# Patient Record
Sex: Female | Born: 2000 | Race: White | Hispanic: No | Marital: Single | Smoking: Never smoker
Health system: Southern US, Community
[De-identification: ages and names within clinical notes are randomized; demographics above are authoritative.]

---
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
## 2021-10-29 ENCOUNTER — Other Ambulatory Visit: Payer: Self-pay

## 2021-10-29 ENCOUNTER — Encounter: Payer: Self-pay | Admitting: Oncology

## 2021-10-29 ENCOUNTER — Ambulatory Visit (INDEPENDENT_AMBULATORY_CARE_PROVIDER_SITE_OTHER): Admitting: Oncology

## 2021-10-29 VITALS — BP 102/60 | HR 66 | Temp 99.2°F | Resp 18 | Ht 64.0 in | Wt 134.0 lb

## 2021-10-29 DIAGNOSIS — R0981 Nasal congestion: Secondary | ICD-10-CM | POA: Diagnosis not present

## 2021-10-29 DIAGNOSIS — R051 Acute cough: Secondary | ICD-10-CM | POA: Diagnosis not present

## 2021-10-29 MED ORDER — AZITHROMYCIN 250 MG PO TABS: ORAL_TABLET | ORAL | 0 refills | Status: AC

## 2021-10-29 MED ORDER — BENZONATATE 100 MG PO CAPS: 100.0000 mg | ORAL_CAPSULE | Freq: Three times a day (TID) | ORAL | 0 refills | Status: DC | PRN

## 2021-10-29 NOTE — Progress Notes (Signed)
[facility] [address] [place], [place] [address] Phone: [phone] Fax: [phone]   Office Visit Note  Patient Name: Susan Black  Date of [id number]:[id number]  Med Rec number [id number]  Date of Service: 10/29/2021  Patient has no known allergies.  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  Describes mucus as thick green and yellow.  Chest pain associated with cough.  Cough mainly at bedtime.  Unable to sleep.  Denies any COVID exposure. Gets bronchitis often and is normally prescribed benzonatate and an antibiotic. Tx last about a year ago. No asthma hx. Heard some wheezing last night. Has not tried anything otc.  Feels like bronchitis.  Current Medication:  Outpatient Encounter Medications as of 10/29/2021  Medication Sig   azithromycin (ZITHROMAX) 250 MG tablet Take 2 tablets on day 1, then 1 tablet daily on days 2 through 5   benzonatate (TESSALON PERLES) 100 MG capsule Take 1 capsule (100 mg total) by mouth 3 (three) times daily as needed for cough.   [DISCONTINUED] amoxicillin-clavulanate (AUGMENTIN) 875-125 MG tablet Take 1 tablet by mouth 2 (two) times daily.   [DISCONTINUED] guanFACINE (INTUNIV) 1 MG TB24 ER tablet Take 1 mg by mouth daily.   No facility-administered encounter medications on file as of 10/29/2021.      Medical History: History reviewed. No pertinent past medical history.   Vital Signs: BP 102/60   Pulse 66   Temp 99.2 F (37.3 C) (Tympanic)   Resp 18   Ht 5\' 4"  (1.626 m)   Wt 134 lb (60.8 kg)   SpO2 99%   BMI 23.00 kg/m   ROS: As per HPI.  All other pertinent ROS negative.     Review of Systems  Constitutional:  Positive for fatigue.  HENT:  Positive for postnasal drip and sore throat.   Respiratory:  Positive for cough, shortness of breath and wheezing.   Cardiovascular:  Positive for chest pain.   Musculoskeletal:  Positive for myalgias.  Psychiatric/Behavioral:  Positive for sleep disturbance (d/t cough).     Physical Exam Constitutional:      Appearance: Normal appearance.  HENT:     Nose:     Right Turbinates: Pale.     Left Turbinates: Pale.     Mouth/Throat:     Mouth: Mucous membranes are moist.     Pharynx: Posterior oropharyngeal erythema present.     Tonsils: 1+ on the right. 2+ on the left.  Cardiovascular:     Rate and Rhythm: Normal rate and regular rhythm.  Pulmonary:     Effort: Pulmonary effort is normal.     Breath sounds: Examination of the right-lower field reveals rhonchi. Rhonchi present. No decreased breath sounds.  Neurological:     Mental Status: She is alert.     No results found for this or any previous visit (from the past 24 hour(s)).  Assessment/Plan: 1. Acute cough -Exam concerning for Bronchitis and URI.  Given history would recommend going ahead and treating. -Recommend azithromycin x5 days starting today.  Can also try Tessalon perles 3 times daily for cough.  Can try over-the-counter Delsym as well. -Recommend picking up a decongestant such as Mucinex and/or Sudafed.  - benzonatate (TESSALON PERLES) 100 MG capsule; Take 1 capsule (100 mg total) by mouth 3 (three) times daily as needed for  cough.  Dispense: 20 capsule; Refill: 0 - azithromycin (ZITHROMAX) 250 MG tablet; Take 2 tablets on day 1, then 1 tablet daily on days 2 through 5  Dispense: 6 tablet; Refill: 0  2. Nasal congestion -Decongestant such as Sudafed or Mucinex. -See plan above.   Disposition-return to clinic as needed.  General Counseling: jane birkel understanding of the findings of todays visit and agrees with plan of treatment. I have discussed any further diagnostic evaluation that may be needed or ordered today. We also reviewed her medications today. she has been encouraged to call the office with any questions or concerns that should arise related to  todays visit.   No orders of the defined types were placed in this encounter.   Meds ordered this encounter  Medications   benzonatate (TESSALON PERLES) 100 MG capsule    Sig: Take 1 capsule (100 mg total) by mouth 3 (three) times daily as needed for cough.    Dispense:  20 capsule    Refill:  0   azithromycin (ZITHROMAX) 250 MG tablet    Sig: Take 2 tablets on day 1, then 1 tablet daily on days 2 through 5    Dispense:  6 tablet    Refill:  0    I spent 15 minutes dedicated to the care of this patient (face-to-face and non-face-to-face) on the date of the encounter to include what is described in the assessment and plan.   Durenda Hurt, NP 10/29/2021 10:49 AM

## 2021-12-12 IMAGING — CR DG NECK SOFT TISSUE
2 series · 2 of 2 positions shown · non-contrast
Comparison: None.

CLINICAL DATA: Sore throat

EXAM:
NECK SOFT TISSUES - 1+ VIEW

[neck lat]
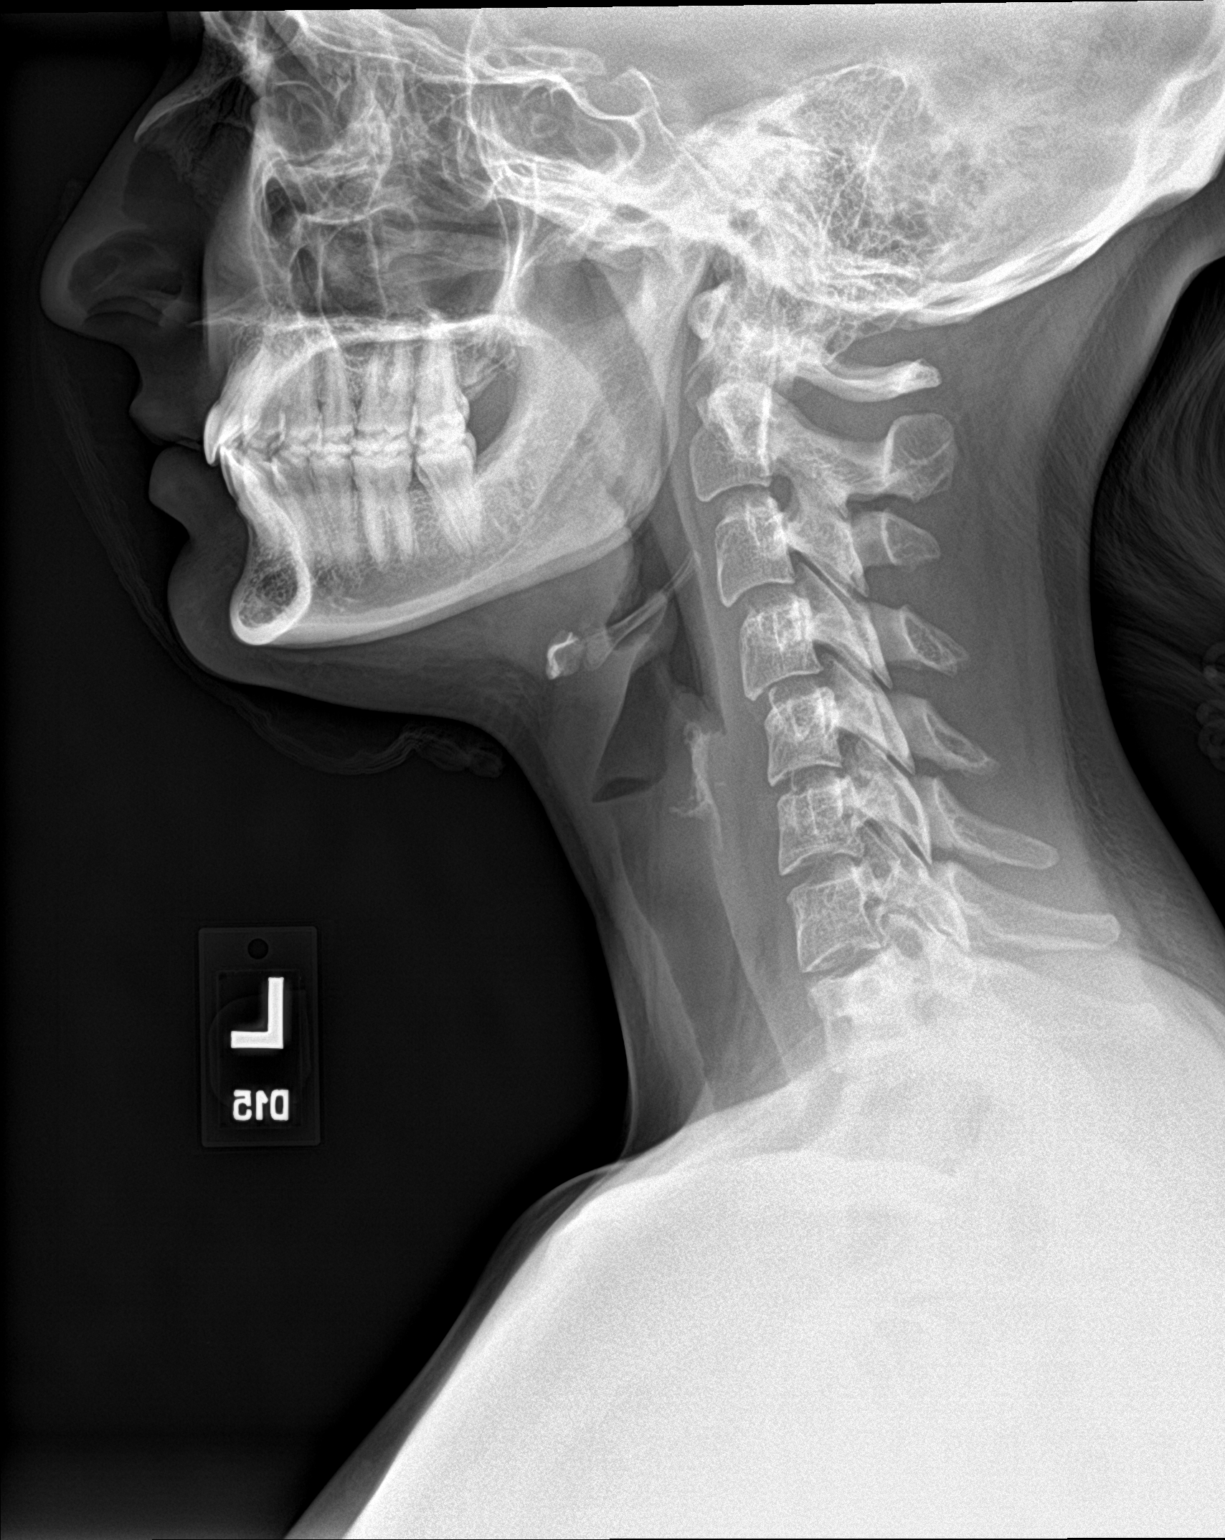

[neck ap]
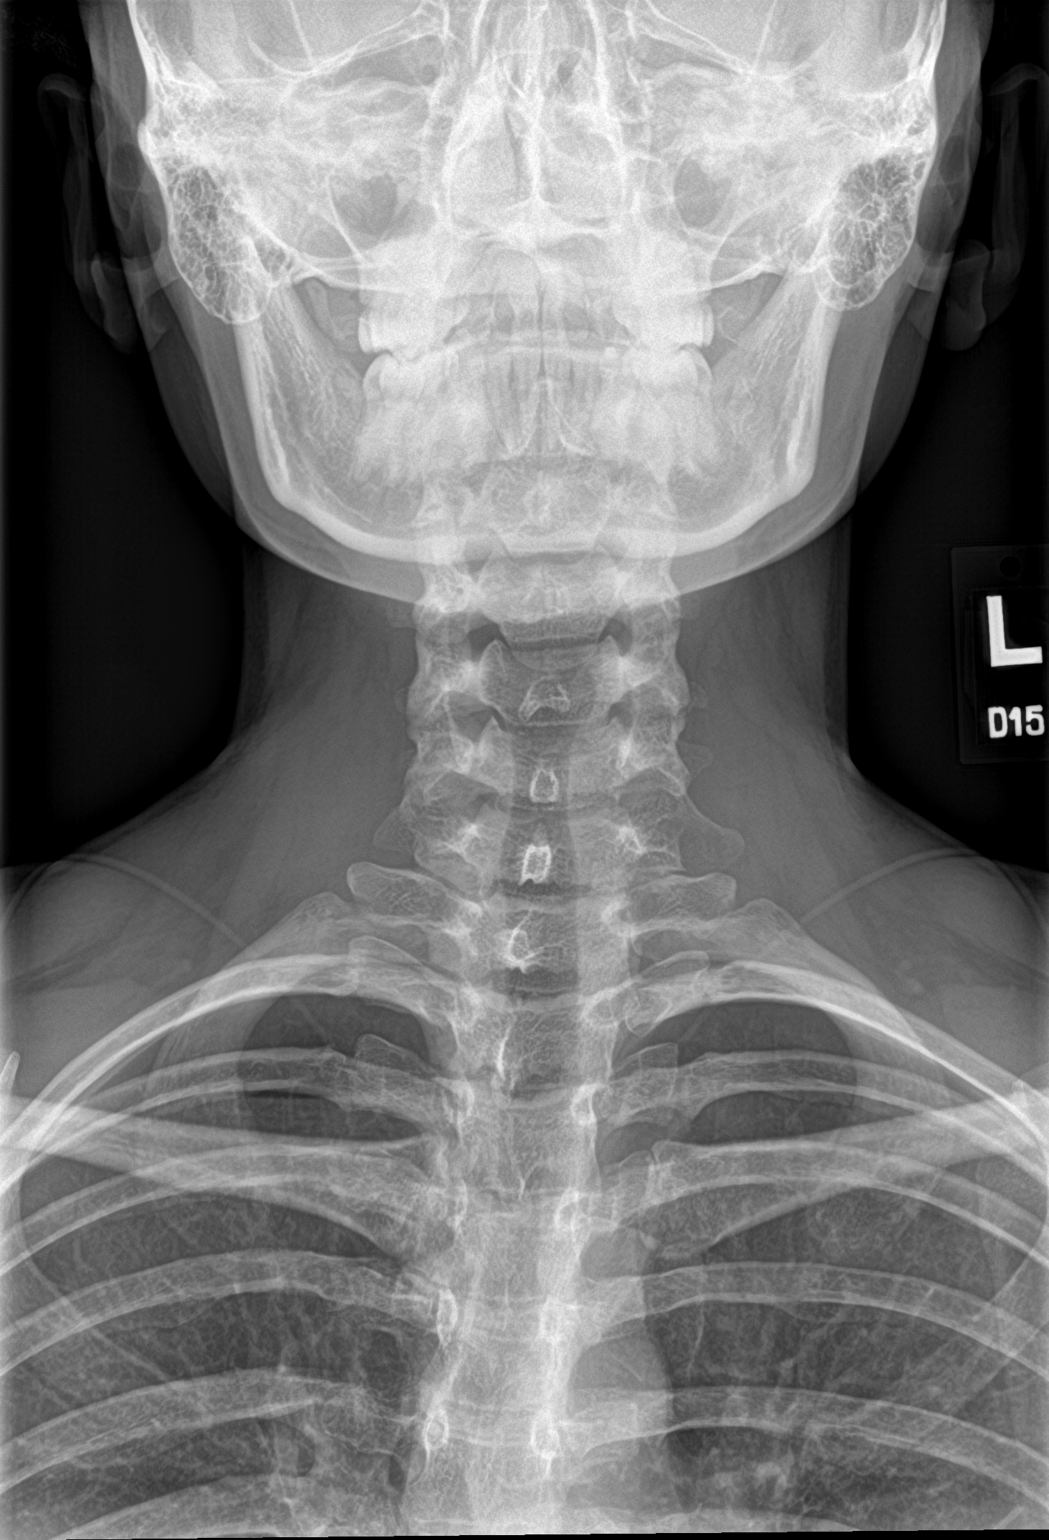

[2 of 2 positions shown; findings below may reference images not displayed]

FINDINGS: There is no evidence of retropharyngeal soft tissue swelling or
epiglottic enlargement. The cervical airway is unremarkable and no
radio-opaque foreign body identified.
IMPRESSION: Negative.

## 2022-02-25 ENCOUNTER — Ambulatory Visit: Admitting: Oncology

## 2022-03-02 ENCOUNTER — Encounter: Payer: Self-pay | Admitting: Oncology

## 2022-03-02 ENCOUNTER — Other Ambulatory Visit: Payer: Self-pay

## 2022-03-02 ENCOUNTER — Ambulatory Visit (INDEPENDENT_AMBULATORY_CARE_PROVIDER_SITE_OTHER): Admitting: Oncology

## 2022-03-02 VITALS — BP 110/64 | HR 66 | Temp 97.1°F | Resp 18 | Ht 64.0 in | Wt 128.0 lb

## 2022-03-02 DIAGNOSIS — L709 Acne, unspecified: Secondary | ICD-10-CM | POA: Diagnosis not present

## 2022-03-02 NOTE — Progress Notes (Signed)
[place] [place], [place] [address] Phone: [phone] Fax: [phone]   Office Visit Note  Patient Name: Susan Black  Date of [id number]  Med Rec number [id number]  Date of Service: 03/02/2022  Patient has no known allergies.  No chief complaint on file.  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  On spironolactone for acne.  Tolerating well. Has been on for 3 months.  No concerns at this time.  No signs of weakness or cardiac issues. No N/V/D.   Current Medication:  Outpatient Encounter Medications as of 03/02/2022  Medication Sig   benzonatate (TESSALON PERLES) 100 MG capsule Take 1 capsule (100 mg total) by mouth 3 (three) times daily as needed for cough.   No facility-administered encounter medications on file as of 03/02/2022.   Medical History: No past medical history on file.  Vital Signs: There were no vitals taken for this visit.  ROS: As per HPI.  All other pertinent ROS negative.     Review of Systems  Cardiovascular:  Negative for chest pain.    Physical Exam Constitutional:      Appearance: Normal appearance.  Neurological:     Mental Status: She is alert and oriented to person, place, and time.    No results found for this or any previous visit (from the past 24 hour(s)).  Assessment/Plan: 1. Acne, unspecified acne type - Potassium level today.  - Will send results via mychart.  -MD will send order via e-mail or mychart. -Discussed we need diagnosis code and order faxed, e-mailed or sent via mychart.   - Potassium   Disposition- RTC as needed.   General Counseling: Susan Black understanding of the findings of todays visit and agrees with plan of treatment. I have discussed any further diagnostic evaluation that may be needed or ordered today. We also reviewed her medications today. she has been encouraged to call the office with any questions or concerns that should arise  related to todays visit.   No orders of the defined types were placed in this encounter.   No orders of the defined types were placed in this encounter.   I spent 20 minutes dedicated to the care of this patient (face-to-face and non-face-to-face) on the date of the encounter to include what is described in the assessment and plan.   Faythe Casa, NP 03/02/2022 8:26 AM

## 2022-03-03 LAB — POTASSIUM: Potassium: 4 mmol/L (ref 3.5–5.2)

## 2022-07-06 ENCOUNTER — Encounter: Payer: Self-pay | Admitting: Oncology

## 2022-07-06 ENCOUNTER — Ambulatory Visit (INDEPENDENT_AMBULATORY_CARE_PROVIDER_SITE_OTHER): Admitting: Oncology

## 2022-07-06 ENCOUNTER — Other Ambulatory Visit: Payer: Self-pay

## 2022-07-06 VITALS — BP 108/61 | HR 68 | Temp 99.0°F | Wt 134.0 lb

## 2022-07-06 DIAGNOSIS — N898 Other specified noninflammatory disorders of vagina: Secondary | ICD-10-CM | POA: Diagnosis not present

## 2022-07-06 MED ORDER — MUPIROCIN 2 % EX OINT: 1.0000 | TOPICAL_OINTMENT | Freq: Two times a day (BID) | CUTANEOUS | 0 refills | Status: AC

## 2022-07-06 MED ORDER — DOXYCYCLINE HYCLATE 100 MG PO TABS: 100.0000 mg | ORAL_TABLET | Freq: Two times a day (BID) | ORAL | 0 refills | Status: AC

## 2022-07-06 NOTE — Progress Notes (Unsigned)
[facility] [address] [place], [place] [address] Phone: [phone] Fax: [phone]  Office Visit Note  Patient Name: Susan Black  Date of [id number]  Med Rec number [id number]  Date of Service: 07/06/2022  Patient has no known allergies.  Chief Complaint  Patient presents with   Vaginitis   HPI Patient is an 22 y.o. student here for complaints of vaginal infection. Noticed a leison right above clitoris that enlarged and was painful and popped on its own with white/bloody drainage. Mild pain and this point. Has been wearing a panty liner to have it covered.   Current Medication:  Outpatient Encounter Medications as of 07/06/2022  Medication Sig   levonorgestrel (KYLEENA) 19.5 MG IUD Take 1 device by intrauterine route.   escitalopram (LEXAPRO) 10 MG tablet    spironolactone (ALDACTONE) 50 MG tablet    No facility-administered encounter medications on file as of 07/06/2022.   Medical History: History reviewed. No pertinent past medical history.  Vital Signs: BP 108/61   Pulse 68   Temp 99 F (37.2 C) (Tympanic)   Wt 134 lb (60.8 kg)   SpO2 99%   BMI 23.00 kg/m   ROS: As per HPI.  All other pertinent ROS negative.     Review of Systems  Physical Exam  No results found for this or any previous visit (from the past 24 hour(s)).  Assessment/Plan:   General Counseling: Maryla Morrow understanding of the findings of todays visit and agrees with plan of treatment. I have discussed any further diagnostic evaluation that may be needed or ordered today. We also reviewed her medications today. she has been encouraged to call the office with any questions or concerns that should arise related to todays visit.   No orders of the defined types were placed in this encounter.   No orders of the defined types were placed in this encounter.   I spent *** minutes dedicated to the care of this patient (face-to-face and non-face-to-face) on the  date of the encounter to include what is described in the assessment and plan.   Durenda Hurt, NP 07/06/2022 10:15 AM

## 2022-07-07 ENCOUNTER — Other Ambulatory Visit: Payer: Self-pay | Admitting: Oncology

## 2022-07-07 ENCOUNTER — Other Ambulatory Visit (INDEPENDENT_AMBULATORY_CARE_PROVIDER_SITE_OTHER): Admitting: Oncology

## 2022-07-07 DIAGNOSIS — N898 Other specified noninflammatory disorders of vagina: Secondary | ICD-10-CM

## 2022-07-07 MED ORDER — VALACYCLOVIR HCL 1 G PO TABS: 1000.0000 mg | ORAL_TABLET | Freq: Two times a day (BID) | ORAL | 0 refills | Status: AC

## 2022-07-07 NOTE — Addendum Note (Signed)
Addended by: Durenda Hurt E on: 07/07/2022 11:14 AM   Modules accepted: Level of Service

## 2022-07-07 NOTE — Progress Notes (Signed)
Here for HSV culture only. See previous OV from yesterday.  Aerobic and anaerobic culture swabbed yesterday.   Will send results via mychart.   Durenda Hurt, NP 07/07/2022 11:13 AM

## 2022-07-10 LAB — HERPES SIMPLEX VIRUS CULTURE

## 2022-07-12 ENCOUNTER — Encounter: Payer: Self-pay | Admitting: Oncology

## 2022-07-12 LAB — ANAEROBIC AND AEROBIC CULTURE

## 2022-07-12 NOTE — Progress Notes (Signed)
Good morning Susan Black  Your Herpes test was negative  Dr Durwin Reges Mounir Skipper AB[facility]
